# Patient Record
Sex: Male | Born: 1935 | Race: Black or African American | Hispanic: No | Smoking: Never smoker
Health system: Southern US, Community
[De-identification: ages and names within clinical notes are randomized; demographics above are authoritative.]

## PROBLEM LIST (undated history)

## (undated) DIAGNOSIS — K219 Gastro-esophageal reflux disease without esophagitis: Secondary | ICD-10-CM

## (undated) DIAGNOSIS — E119 Type 2 diabetes mellitus without complications: Secondary | ICD-10-CM

## (undated) DIAGNOSIS — N401 Enlarged prostate with lower urinary tract symptoms: Secondary | ICD-10-CM

## (undated) DIAGNOSIS — I10 Essential (primary) hypertension: Secondary | ICD-10-CM

## (undated) DIAGNOSIS — H547 Unspecified visual loss: Secondary | ICD-10-CM

## (undated) DIAGNOSIS — N138 Other obstructive and reflux uropathy: Secondary | ICD-10-CM

## (undated) DIAGNOSIS — H9190 Unspecified hearing loss, unspecified ear: Secondary | ICD-10-CM

## (undated) HISTORY — PX: SHOULDER SURGERY: SHX246

## (undated) HISTORY — PX: BACK SURGERY: SHX140

---

## 2015-05-13 ENCOUNTER — Emergency Department (HOSPITAL_COMMUNITY)
Admission: EM | Admit: 2015-05-13 | Discharge: 2015-05-13 | Disposition: A | Discharge status: Home / Self Care | Payer: Medicare Other | Attending: Emergency Medicine | Admitting: Emergency Medicine

## 2015-05-13 ENCOUNTER — Emergency Department (HOSPITAL_COMMUNITY): Payer: Medicare Other

## 2015-05-13 ENCOUNTER — Encounter (HOSPITAL_COMMUNITY): Payer: Self-pay

## 2015-05-13 DIAGNOSIS — Z9889 Other specified postprocedural states: Secondary | ICD-10-CM | POA: Diagnosis not present

## 2015-05-13 DIAGNOSIS — Z87448 Personal history of other diseases of urinary system: Secondary | ICD-10-CM | POA: Diagnosis not present

## 2015-05-13 DIAGNOSIS — M545 Low back pain, unspecified: Secondary | ICD-10-CM

## 2015-05-13 DIAGNOSIS — H540 Blindness, both eyes: Secondary | ICD-10-CM | POA: Insufficient documentation

## 2015-05-13 DIAGNOSIS — I10 Essential (primary) hypertension: Secondary | ICD-10-CM | POA: Diagnosis not present

## 2015-05-13 DIAGNOSIS — M549 Dorsalgia, unspecified: Secondary | ICD-10-CM

## 2015-05-13 DIAGNOSIS — E119 Type 2 diabetes mellitus without complications: Secondary | ICD-10-CM | POA: Diagnosis not present

## 2015-05-13 DIAGNOSIS — H9190 Unspecified hearing loss, unspecified ear: Secondary | ICD-10-CM | POA: Insufficient documentation

## 2015-05-13 DIAGNOSIS — R109 Unspecified abdominal pain: Secondary | ICD-10-CM | POA: Diagnosis not present

## 2015-05-13 DIAGNOSIS — Z8719 Personal history of other diseases of the digestive system: Secondary | ICD-10-CM | POA: Insufficient documentation

## 2015-05-13 HISTORY — DX: Unspecified hearing loss, unspecified ear: H91.90

## 2015-05-13 HISTORY — DX: Unspecified visual loss: H54.7

## 2015-05-13 HISTORY — DX: Other obstructive and reflux uropathy: N13.8

## 2015-05-13 HISTORY — DX: Essential (primary) hypertension: I10

## 2015-05-13 HISTORY — DX: Type 2 diabetes mellitus without complications: E11.9

## 2015-05-13 HISTORY — DX: Gastro-esophageal reflux disease without esophagitis: K21.9

## 2015-05-13 HISTORY — DX: Benign prostatic hyperplasia with lower urinary tract symptoms: N40.1

## 2015-05-13 LAB — URINALYSIS, ROUTINE W REFLEX MICROSCOPIC
Bilirubin Urine: NEGATIVE
Glucose, UA: NEGATIVE mg/dL
Nitrite: NEGATIVE
PH: 6.5 (ref 5.0–8.0)
SPECIFIC GRAVITY, URINE: 1.02 (ref 1.005–1.030)
Urobilinogen, UA: 0.2 mg/dL (ref 0.0–1.0)

## 2015-05-13 LAB — CBC WITH DIFFERENTIAL/PLATELET
Basophils Absolute: 0 10*3/uL (ref 0.0–0.1)
Basophils Relative: 0 % (ref 0–1)
EOS ABS: 0 10*3/uL (ref 0.0–0.7)
Eosinophils Relative: 0 % (ref 0–5)
HCT: 34.1 % — ABNORMAL LOW (ref 39.0–52.0)
HEMOGLOBIN: 11.1 g/dL — AB (ref 13.0–17.0)
LYMPHS ABS: 1.9 10*3/uL (ref 0.7–4.0)
Lymphocytes Relative: 24 % (ref 12–46)
MCH: 26.6 pg (ref 26.0–34.0)
MCHC: 32.6 g/dL (ref 30.0–36.0)
MCV: 81.6 fL (ref 78.0–100.0)
Monocytes Absolute: 0.8 10*3/uL (ref 0.1–1.0)
Monocytes Relative: 10 % (ref 3–12)
NEUTROS ABS: 5.1 10*3/uL (ref 1.7–7.7)
Neutrophils Relative %: 66 % (ref 43–77)
PLATELETS: 176 10*3/uL (ref 150–400)
RBC: 4.18 MIL/uL — ABNORMAL LOW (ref 4.22–5.81)
RDW: 13.3 % (ref 11.5–15.5)
WBC: 7.8 10*3/uL (ref 4.0–10.5)

## 2015-05-13 LAB — COMPREHENSIVE METABOLIC PANEL
ALT: 10 U/L — ABNORMAL LOW (ref 17–63)
ANION GAP: 10 (ref 5–15)
AST: 14 U/L — AB (ref 15–41)
Albumin: 3.5 g/dL (ref 3.5–5.0)
Alkaline Phosphatase: 53 U/L (ref 38–126)
BILIRUBIN TOTAL: 0.6 mg/dL (ref 0.3–1.2)
BUN: 11 mg/dL (ref 6–20)
CALCIUM: 9.2 mg/dL (ref 8.9–10.3)
CHLORIDE: 97 mmol/L — AB (ref 101–111)
CO2: 29 mmol/L (ref 22–32)
CREATININE: 0.85 mg/dL (ref 0.61–1.24)
GFR calc Af Amer: >60 mL/min (ref >60)
Glucose, Bld: 145 mg/dL — ABNORMAL HIGH (ref 65–99)
Potassium: 3.2 mmol/L — ABNORMAL LOW (ref 3.5–5.1)
Sodium: 136 mmol/L (ref 135–145)
TOTAL PROTEIN: 7 g/dL (ref 6.5–8.1)

## 2015-05-13 LAB — LIPASE, BLOOD: Lipase: 28 U/L (ref 22–51)

## 2015-05-13 LAB — URINE MICROSCOPIC-ADD ON

## 2015-05-13 MED ORDER — OXYCODONE-ACETAMINOPHEN 5-325 MG PO TABS
1.0000 | ORAL_TABLET | Freq: Once | ORAL | Status: AC
  Administered 2015-05-13: 1 via ORAL
  Filled 2015-05-13: qty 1

## 2015-05-13 MED ORDER — HYDROMORPHONE HCL 1 MG/ML IJ SOLN
0.5000 mg | Freq: Once | INTRAMUSCULAR | Status: AC
  Administered 2015-05-13: 0.5 mg via INTRAVENOUS
  Filled 2015-05-13: qty 1

## 2015-05-13 MED ORDER — HYDROCODONE-ACETAMINOPHEN 5-325 MG PO TABS: 1.0000 | ORAL_TABLET | Freq: Four times a day (QID) | ORAL | Status: AC | PRN

## 2015-05-13 NOTE — ED Notes (Signed)
Pt able to ambulate with 2 staff and minmum assistance to restroom.  Pt gait steady with uses of cane.

## 2015-05-13 NOTE — ED Notes (Signed)
Pt c/o pain in lower back x 3 weeks.  Reports history of intermittent lower back pain.   Reports this pain feels the same.   Denies injury.  ALso reports strong odor to urine for the past 24 hours.   Last voided several hours ago.   CBG 227 per EMS.

## 2015-05-13 NOTE — ED Notes (Signed)
Pt not able to ambulate.  C/o of catch in his back when standing.  Attempted with 2 staff members to assist with voiding, gait unsteady.

## 2015-05-13 NOTE — Discharge Instructions (Signed)
Take a stool softener, such as colace, available over the counter when taking your pain medication.  Your CT scan showed a spot on your right kidney that needs to be evaluated by your Urologist. Get rechecked immediately if you develop any new or concerning symptoms.    Back Pain, Adult Low back pain is very common. About 1 in 5 people have back pain.The cause of low back pain is rarely dangerous. The pain often gets better over time.About half of people with a sudden onset of back pain feel better in just 2 weeks. About 8 in 10 people feel better by 6 weeks.  CAUSES Some common causes of back pain include:  Strain of the muscles or ligaments supporting the spine.  Wear and tear (degeneration) of the spinal discs.  Arthritis.  Direct injury to the back. DIAGNOSIS Most of the time, the direct cause of low back pain is not known.However, back pain can be treated effectively even when the exact cause of the pain is unknown.Answering your caregiver's questions about your overall health and symptoms is one of the most accurate ways to make sure the cause of your pain is not dangerous. If your caregiver needs more information, he or she may order lab work or imaging tests (X-rays or MRIs).However, even if imaging tests show changes in your back, this usually does not require surgery. HOME CARE INSTRUCTIONS For many people, back pain returns.Since low back pain is rarely dangerous, it is often a condition that people can learn to [facility] their own.   Remain active. It is stressful on the back to sit or stand in one place. Do not sit, drive, or stand in one place for more than 30 minutes at a time. Take short walks on level surfaces as soon as pain allows.Try to increase the length of time you walk each day.  Do not stay in bed.Resting more than 1 or 2 days can delay your recovery.  Do not avoid exercise or work.Your body is made to move.It is not dangerous to be active, even though your  back may hurt.Your back will likely heal faster if you return to being active before your pain is gone.  Pay attention to your body when you bend and lift. Many people have less discomfortwhen lifting if they bend their knees, keep the load close to their bodies,and avoid twisting. Often, the most comfortable positions are those that put less stress on your recovering back.  Find a comfortable position to sleep. Use a firm mattress and lie on your side with your knees slightly bent. If you lie on your back, put a pillow under your knees.  Only take over-the-counter or prescription medicines as directed by your caregiver. Over-the-counter medicines to reduce pain and inflammation are often the most helpful.Your caregiver may prescribe muscle relaxant drugs.These medicines help dull your pain so you can more quickly return to your normal activities and healthy exercise.  Put ice on the injured area.  Put ice in a plastic bag.  Place a towel between your skin and the bag.  Leave the ice on for 15-20 minutes, 03-04 times a day for the first 2 to 3 days. After that, ice and heat may be alternated to reduce pain and spasms.  Ask your caregiver about trying back exercises and gentle massage. This may be of some benefit.  Avoid feeling anxious or stressed.Stress increases muscle tension and can worsen back pain.It is important to recognize when you are anxious or stressed and  learn ways to manage it.Exercise is a great option. SEEK MEDICAL CARE IF:  You have pain that is not relieved with rest or medicine.  You have pain that does not improve in 1 week.  You have new symptoms.  You are generally not feeling well. SEEK IMMEDIATE MEDICAL CARE IF:   You have pain that radiates from your back into your legs.  You develop new bowel or bladder control problems.  You have unusual weakness or numbness in your arms or legs.  You develop nausea or vomiting.  You develop abdominal  pain.  You feel faint. Document Released: 11/17/2005 Document Revised: 05/18/2012 Document Reviewed: 03/21/2014 [facility] Patient Information 2015 [place], [place]. This information is not intended to replace advice given to you by your health care provider. Make sure you discuss any questions you have with your health care provider.

## 2015-05-13 NOTE — ED Provider Notes (Signed)
CSN: [id number]     Arrival date & time 05/13/15  5537 History  This chart was scribed for Aaron Fossa, MD by [name], ED Scribe. This patient was seen in room APA03/APA03 and the patient's care was started at 10:15 AM.   Chief Complaint  Patient presents with  . Back Pain   The history is provided by the patient. No language interpreter was used.   HPI Comments: Aaron Hudson is a 79 y.o. male who presents to the Emergency Department complaining of constant, sharp and dull, periumbilical abdominal pain with radiation to the lower back pain onset 1 month ago.  He reports no particular change in his complaint prompted his visit today; the complaint just "bothered" him all night.  There are no aggravating or alleviating factors.  He reports his last normal bowel movement was 3 days ago.  He has also been having some difficulty urinating over the past three weeks and has been seen by a urologist but cannot recall his name.  He denies fever, n/v/d, dysuria, leg numbness, weakness, fall, injury.  NKA. Past Medical History  Diagnosis Date  . Hypertension   . Blind   . HOH (hard of hearing)   . GERD (gastroesophageal reflux disease)   . Diabetes mellitus without complication   . BPH (benign prostatic hypertrophy) with urinary obstruction    Past Surgical History  Procedure Laterality Date  . Back surgery    . Shoulder surgery     No family history on file. History  Substance Use Topics  . Smoking status: Never Smoker   . Smokeless tobacco: Not on file  . Alcohol Use: No     Comment: former    Review of Systems  Constitutional: Negative for fever.  Gastrointestinal: Positive for abdominal pain. Negative for nausea, vomiting and diarrhea.  Musculoskeletal: Positive for back pain.  Neurological: Negative for weakness and numbness.  All other systems reviewed and are negative.     Allergies  Review of patient's allergies indicates no known allergies.  Home Medications    Prior to Admission medications   Not on File   BP 166/75 mmHg  Pulse 63  Temp(Src) 98.6 F (37 C) (Oral)  Resp 18  Ht 5\' 5"  (1.651 m)  Wt 180 lb (81.647 kg)  BMI 29.95 kg/m2  SpO2 98% Physical Exam  Constitutional: He is oriented to person, place, and time. He appears well-developed and well-nourished. No distress.  HENT:  Head: Normocephalic and atraumatic.  Eyes:  Corneal clouding bilaterally.   Cardiovascular: Normal rate and regular rhythm.   No murmur heard. Pulmonary/Chest: Effort normal and breath sounds normal. No respiratory distress.  Abdominal: Soft. Bowel sounds are normal. There is no tenderness. There is no rebound and no guarding.  Musculoskeletal: He exhibits no edema or tenderness.  Reproducible pain to his back with raising either leg.  2+ DP pulses bilaterally.   Neurological: He is alert and oriented to person, place, and time.  5/5 strength in BLEs.   Skin: Skin is warm and dry.  Psychiatric: He has a normal mood and affect. His behavior is normal.  Nursing note and vitals reviewed.   ED Course  Procedures (including critical care time)  DIAGNOSTIC STUDIES: Oxygen Saturation is 98% on RA, normal by my interpretation.    COORDINATION OF CARE:  10:26 AM Discussed treatment plan with patient at bedside.  Patient acknowledges and agrees with plan.    Labs Review Labs Reviewed  URINALYSIS, ROUTINE W REFLEX MICROSCOPIC (NOT  AT [facility]) - Abnormal; Notable for the following:    Hgb urine dipstick TRACE (*)    Ketones, ur TRACE (*)    Protein, ur >300 (*)    Leukocytes, UA SMALL (*)    All other components within normal limits  URINE MICROSCOPIC-ADD ON - Abnormal; Notable for the following:    Squamous Epithelial / LPF FEW (*)    Bacteria, UA FEW (*)    All other components within normal limits  COMPREHENSIVE METABOLIC PANEL - Abnormal; Notable for the following:    Potassium 3.2 (*)    Chloride 97 (*)    Glucose, Bld 145 (*)    AST 14 (*)     ALT 10 (*)    All other components within normal limits  CBC WITH DIFFERENTIAL/PLATELET - Abnormal; Notable for the following:    RBC 4.18 (*)    Hemoglobin 11.1 (*)    HCT 34.1 (*)    All other components within normal limits  LIPASE, BLOOD    Imaging Review Ct Renal Stone Study  05/13/2015   CLINICAL DATA:  79 year old male with a 1 month history of constant periumbilical abdominal pain radiating into the lower back.  EXAM: CT ABDOMEN AND PELVIS WITHOUT CONTRAST  TECHNIQUE: Multidetector CT imaging of the abdomen and pelvis was performed following the standard protocol without IV contrast.  COMPARISON:  None.  FINDINGS: Lower Chest: Mild dependent atelectasis in the lower lobes. Mild cardiomegaly. Extensive calcifications along the course of the visualized coronary arteries. No pericardial effusion. Unremarkable visualized distal thoracic esophagus.  Abdomen: Unenhanced CT was performed per clinician order. Lack of IV contrast limits sensitivity and specificity, especially for evaluation of abdominal/pelvic solid viscera. Within these limitations, unremarkable CT appearance of the stomach, duodenum, spleen, adrenal glands and pancreas. Macro lobular contour of the liver is nonspecific. No discrete hepatic lesion. The gallbladder is surgically absent. No intra or extrahepatic biliary ductal dilatation.  Soft tissue density fullness noted within the right upper pole collecting system. This is incompletely evaluated in the absence of intravenous contrast but is concerning for possible urothelial malignancy versus centrally exophytic renal neoplasm. Otherwise, no focal contour abnormality involving either kidney. No hydronephrosis and no nephrolithiasis.  No evidence of obstruction or focal bowel wall thickening. Normal appendix in the right lower quadrant. The terminal ileum is unremarkable. Mild to moderate amount of colonic gas. No free fluid or suspicious adenopathy.  Pelvis: Unremarkable bladder,  prostate gland and seminal vesicles. No free fluid or suspicious adenopathy.  Bones/Soft Tissues: No acute fracture or aggressive appearing lytic or blastic osseous lesion. Prior surgical changes of L2- L5 posterior lumbar interbody fusion.  Vascular: Limited evaluation in the absence of intravenous contrast. Atherosclerotic vascular calcifications without evidence of aneurysm.  IMPRESSION: 1. No acute abnormality in the abdomen or pelvis to explain the patient's clinical symptoms. 2. Moderate volume of gas throughout the colon. This could represent a source for generalized abdominal discomfort. 3. Soft tissue density fullness in the upper pole collecting system of the right kidney is incompletely evaluated in the absence of intravenous contrast material. Differential considerations include a mildly complex and centrally exophytic renal cyst, a centrally exophytic renal neoplasm, and urothelial malignancy involving the upper pole collecting system. Recommend further evaluation with renal protocol CT scan if the patient's renal function permits. Alternately, renal MRI could be considered. 4. Atherosclerosis including multivessel coronary artery calcification. 5. Cardiomegaly. 6. Prior lower lumbar posterior fusion. Degenerative disc disease present at L1-L2.   Electronically Signed  By: Malachy Moan M.D.   On: 05/13/2015 11:43     EKG Interpretation None      MDM   Final diagnoses:  Bilateral low back pain without sciatica    Patient here for evaluation of back pain. Patient is blind. History of presentation is not consistent with cauda equina or epidural abscess. There is no evidence of acute infectious process. CT scan does show a renal cyst versus tumor. Patient is unaware if this is new or old, discussed the findings with patient and his son Ortencia Kick about importance of urology follow-up for further evaluation. Patient feels improved after pain medicine. Discussed outpatient follow-up and return  precautions.  I personally performed the services described in this documentation, which was scribed in my presence. The recorded information has been reviewed and is accurate.     Aaron Fossa, MD 05/14/15 [phone]

## 2015-11-13 ENCOUNTER — Encounter: Payer: Medicare Other | Admitting: Orthopedic Surgery

## 2015-12-11 ENCOUNTER — Ambulatory Visit (INDEPENDENT_AMBULATORY_CARE_PROVIDER_SITE_OTHER): Payer: Medicare Other | Admitting: Orthopedic Surgery

## 2015-12-11 ENCOUNTER — Ambulatory Visit (INDEPENDENT_AMBULATORY_CARE_PROVIDER_SITE_OTHER): Payer: Medicare Other

## 2015-12-11 ENCOUNTER — Encounter: Payer: Self-pay | Admitting: Orthopedic Surgery

## 2015-12-11 DIAGNOSIS — S42202A Unspecified fracture of upper end of left humerus, initial encounter for closed fracture: Secondary | ICD-10-CM

## 2015-12-11 NOTE — Progress Notes (Signed)
NEW FRACTURE  Chief Complaint  Patient presents with  . Arm Injury    Lt proximal humerus fracture, DOI 09/11/15    HPI this patient presents to us for the second time again without any paperwork filled out complaining of a left proximal humerus fracture [place] in [place] on or about October 2016.  He now does not complain of any pain in the left shoulder. He says he's regained most of the use back in his left arm. There are no associated signs or symptoms of swelling, pain. He does have some stiffness in the left shoulder  Review of systems again he says that there is nothing bothering him making his total review of systems negative but I don't believe that he has really filled out all of this paperwork. He listed his medical history is negative his surgical history is negative his allergy history is negative social history is negative. Lives in a group home he did bring a medication list.   BP 158/71 mmHg  Ht 5\' 5"  (1.651 m)  Wt 181 lb (82.101 kg)  BMI 30.12 kg/m2 Gen. appearance grooming and hygiene are normal he has a cane in his hand He is oriented to person place and time Mood is pleasant. Ambulatory status independent with a cane  Left shoulder no tenderness swelling or deformity Passive range of motion 45 external rotation 90 abduction 130 flexion Stability tests were normal Strength test normal internal/external rotation  Skin normal Sensation normal Pulse is normal Lymph nodes axilla negative   Data review First x-ray on the disc proximal humerus fracture is my diagnosis nondisplaced  Order and review x-ray in the office I interpret again healed proximal humerus fracture nondisplaced   Plan  Patient is discharged to normal activity

## 2016-12-09 IMAGING — CT CT RENAL STONE PROTOCOL
2 of 4 series · 15 of 46 positions shown, 17 images · non-contrast
Comparison: None.

CLINICAL DATA: 78-year-old male with a 1 month history of constant
periumbilical abdominal pain radiating into the lower back.

EXAM:
CT ABDOMEN AND PELVIS WITHOUT CONTRAST
TECHNIQUE: Multidetector CT imaging of the abdomen and pelvis was performed
following the standard protocol without IV contrast.

[Series 2: standard/full over (age)lbs 5.0 · axial · 0.72mm/px · z∈[-414,-18]mm · 12 of 95 slices shown, 14 images]
[im 8/95  soft-tissue]
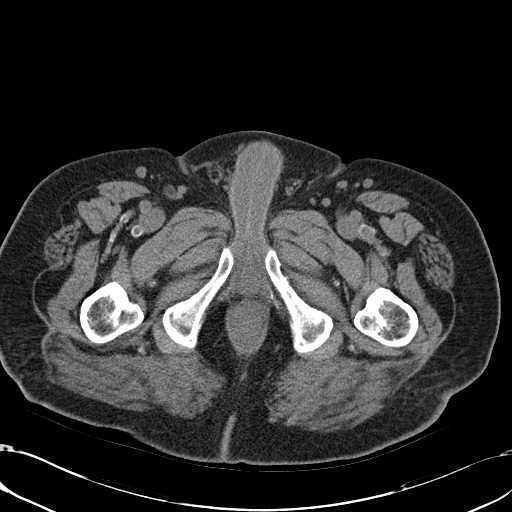
[im 8/95  bone]
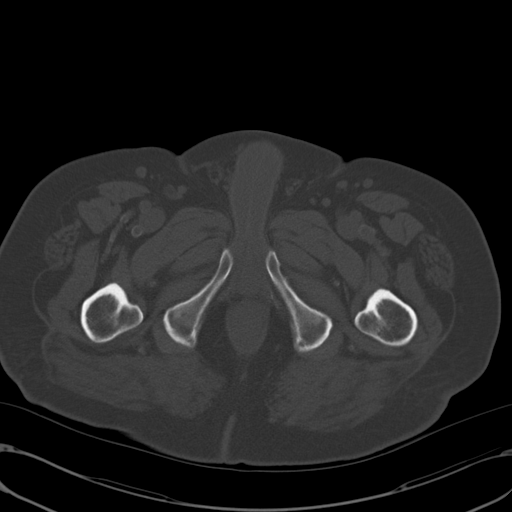
[im 15/95  soft-tissue]
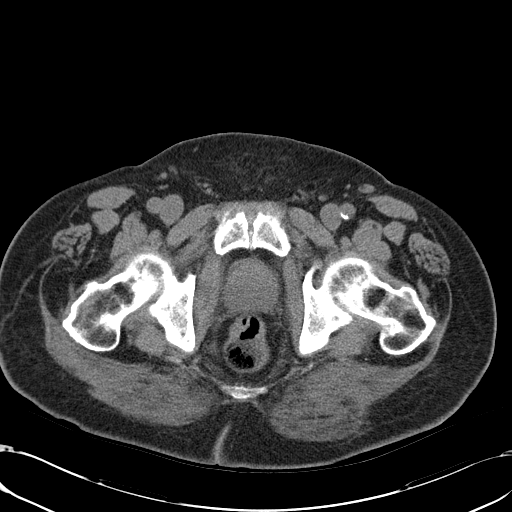
[im 22/95  soft-tissue]
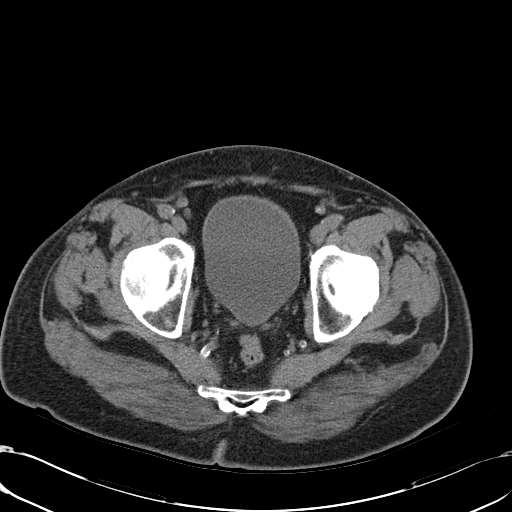
[im 29/95  soft-tissue]
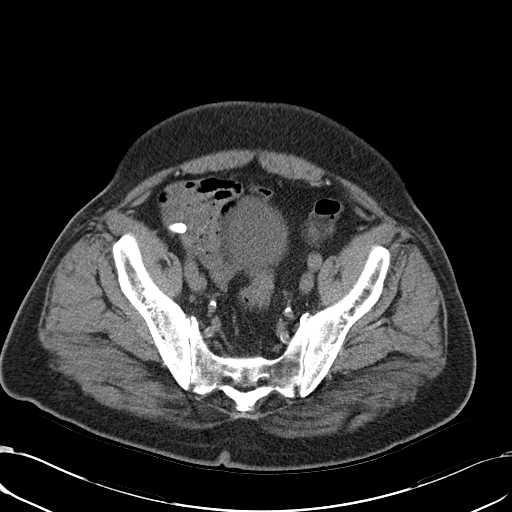
[im 37/95  soft-tissue]
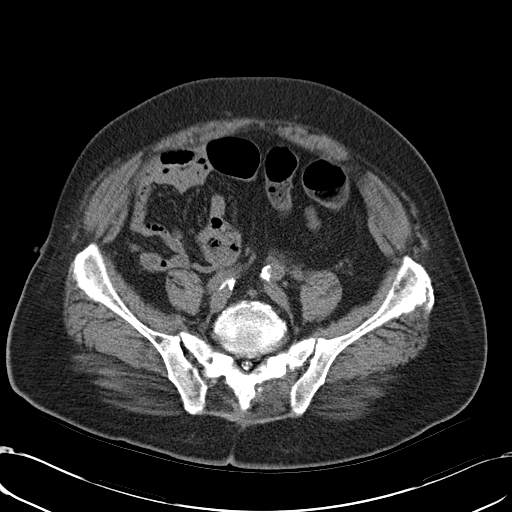
[im 44/95  soft-tissue]
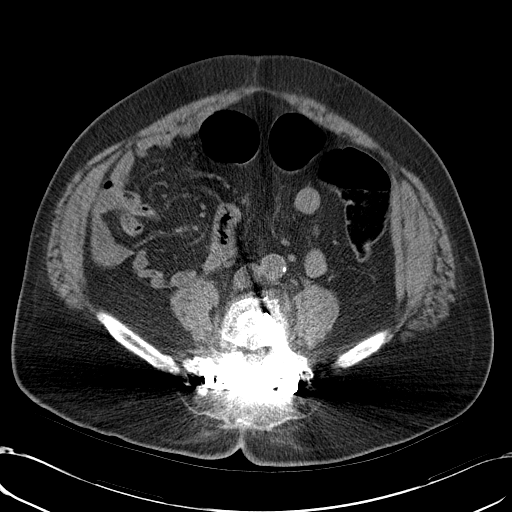
[im 51/95  soft-tissue]
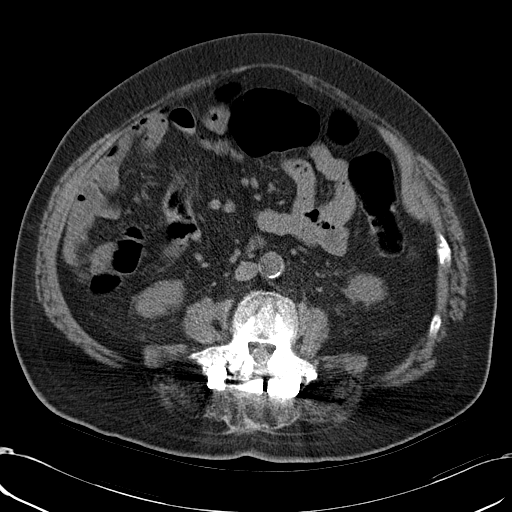
[im 58/95  soft-tissue]
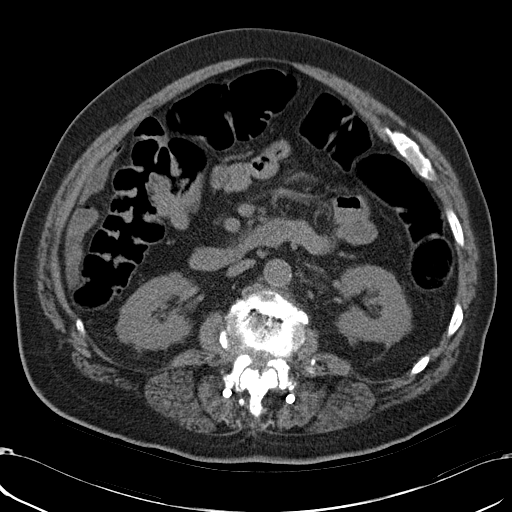
[im 66/95  soft-tissue]
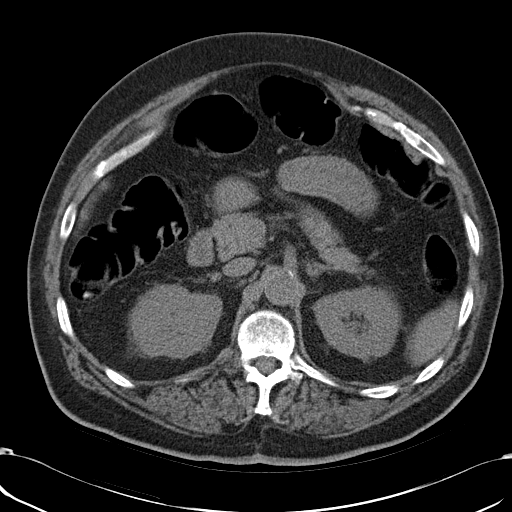
[im 66/95  bone]
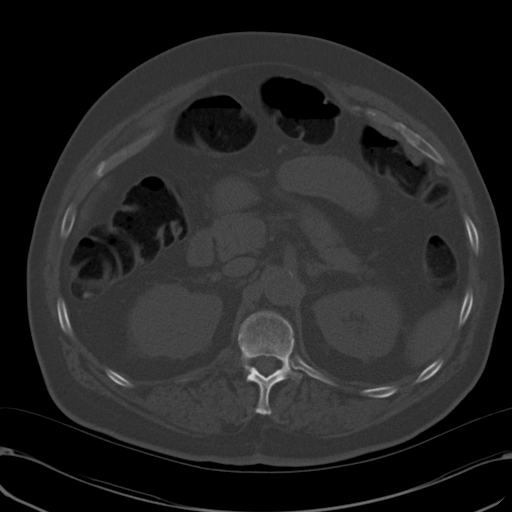
[im 73/95  soft-tissue]
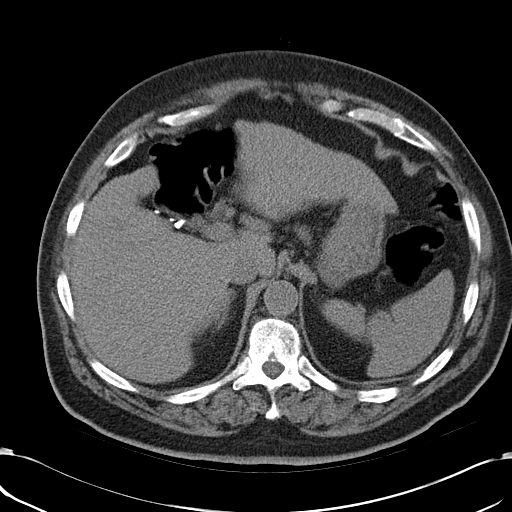
[im 80/95  soft-tissue]
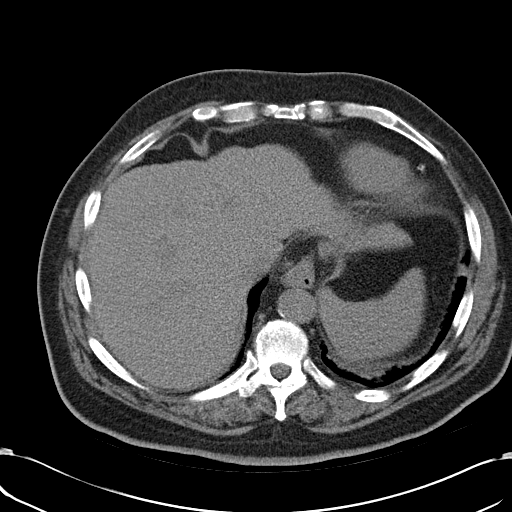
[im 87/95  soft-tissue]
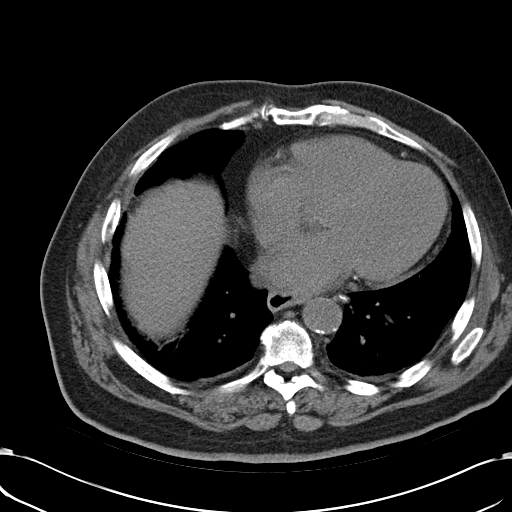

[Series 3: mpr coronal · coronal · 0.75mm/px · 3 of 107 slices shown]
[im 36/107  soft-tissue]
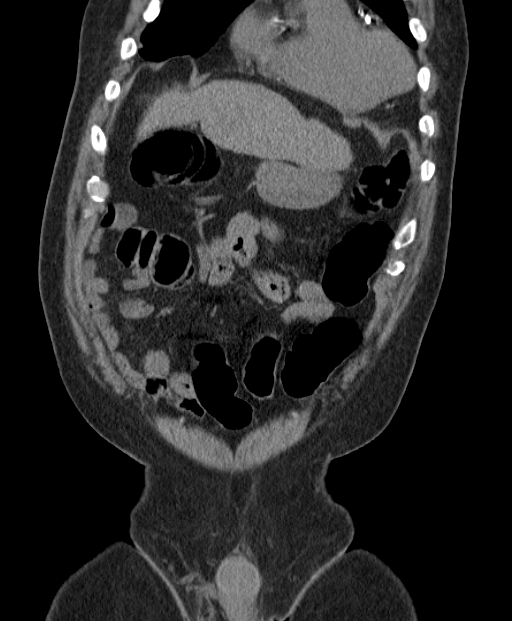
[im 48/107  soft-tissue]
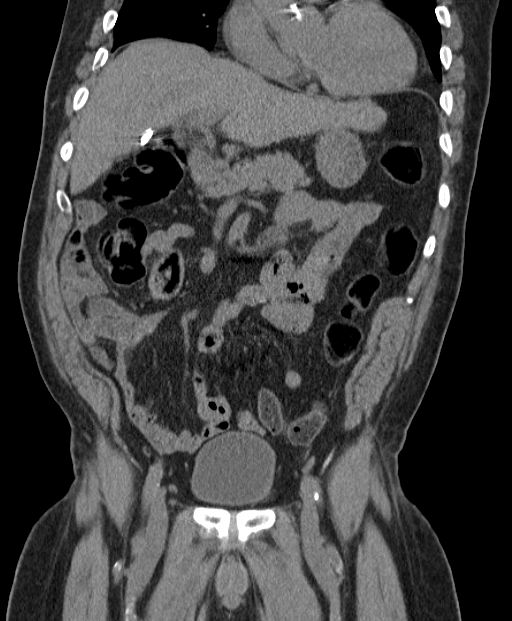
[im 59/107  soft-tissue]
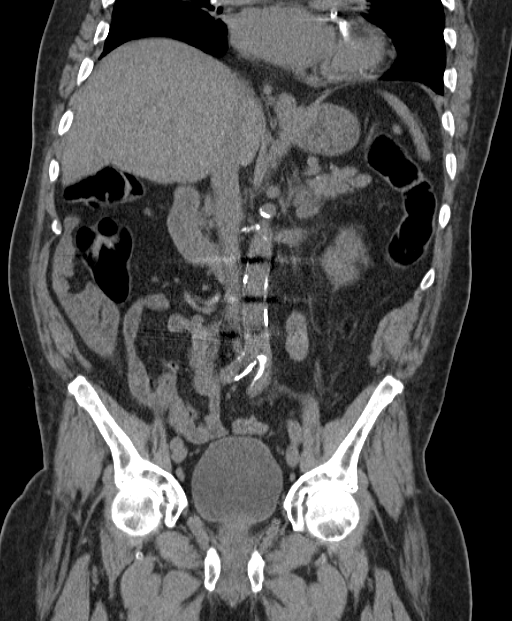

[15 of 46 positions shown; findings below may reference images not displayed]

FINDINGS: Lower Chest: Mild dependent atelectasis in the lower lobes. Mild
cardiomegaly. Extensive calcifications along the course of the
visualized coronary arteries. No pericardial effusion. Unremarkable
visualized distal thoracic esophagus.

Abdomen: Unenhanced CT was performed per clinician order. Lack of IV
contrast limits sensitivity and specificity, especially for
evaluation of abdominal/pelvic solid viscera. Within these
limitations, unremarkable CT appearance of the stomach, duodenum,
spleen, adrenal glands and pancreas. Macro lobular contour of the
liver is nonspecific. No discrete hepatic lesion. The gallbladder is
surgically absent. No intra or extrahepatic biliary ductal
dilatation.

Soft tissue density fullness noted within the right upper pole
collecting system. This is incompletely evaluated in the absence of
intravenous contrast but is concerning for possible urothelial
malignancy versus centrally exophytic renal neoplasm. Otherwise, no
focal contour abnormality involving either kidney. No hydronephrosis
and no nephrolithiasis.

No evidence of obstruction or focal bowel wall thickening. Normal
appendix in the right lower quadrant. The terminal ileum is
unremarkable. Mild to moderate amount of colonic gas. No free fluid
or suspicious adenopathy.

Pelvis: Unremarkable bladder, prostate gland and seminal vesicles.
No free fluid or suspicious adenopathy.

Bones/Soft Tissues: No acute fracture or aggressive appearing lytic
or blastic osseous lesion. Prior surgical changes of L2- L5
posterior lumbar interbody fusion.

Vascular: Limited evaluation in the absence of intravenous contrast.
Atherosclerotic vascular calcifications without evidence of
aneurysm.
IMPRESSION: 1. No acute abnormality in the abdomen or pelvis to explain the
patient's clinical symptoms.
2. Moderate volume of gas throughout the colon. This could represent
a source for generalized abdominal discomfort.
3. Soft tissue density fullness in the upper pole collecting system
of the right kidney is incompletely evaluated in the absence of
intravenous contrast material. Differential considerations include a
mildly complex and centrally exophytic renal cyst, a centrally
exophytic renal neoplasm, and urothelial malignancy involving the
upper pole collecting system. Recommend further evaluation with
renal protocol CT scan if the patient's renal function permits.
Alternately, renal MRI could be considered.
4. Atherosclerosis including multivessel coronary artery
calcification.
5. Cardiomegaly.
6. Prior lower lumbar posterior fusion. Degenerative disc disease
present at L1-L2.

## 2018-02-09 ENCOUNTER — Emergency Department (HOSPITAL_COMMUNITY): Payer: Medicare Other

## 2018-02-09 ENCOUNTER — Emergency Department (HOSPITAL_COMMUNITY)
Admission: EM | Admit: 2018-02-09 | Discharge: 2018-02-09 | Disposition: A | Discharge status: Home / Self Care | Payer: Medicare Other | Attending: Emergency Medicine | Admitting: Emergency Medicine

## 2018-02-09 ENCOUNTER — Encounter (HOSPITAL_COMMUNITY): Payer: Self-pay

## 2018-02-09 DIAGNOSIS — M79661 Pain in right lower leg: Secondary | ICD-10-CM | POA: Diagnosis present

## 2018-02-09 DIAGNOSIS — I10 Essential (primary) hypertension: Secondary | ICD-10-CM | POA: Insufficient documentation

## 2018-02-09 DIAGNOSIS — I82401 Acute embolism and thrombosis of unspecified deep veins of right lower extremity: Secondary | ICD-10-CM | POA: Insufficient documentation

## 2018-02-09 DIAGNOSIS — Z7984 Long term (current) use of oral hypoglycemic drugs: Secondary | ICD-10-CM | POA: Insufficient documentation

## 2018-02-09 DIAGNOSIS — Z79899 Other long term (current) drug therapy: Secondary | ICD-10-CM | POA: Diagnosis not present

## 2018-02-09 DIAGNOSIS — E119 Type 2 diabetes mellitus without complications: Secondary | ICD-10-CM | POA: Insufficient documentation

## 2018-02-09 LAB — COMPREHENSIVE METABOLIC PANEL
ALBUMIN: 1.9 g/dL — AB (ref 3.5–5.0)
ALT: 8 U/L — ABNORMAL LOW (ref 17–63)
ANION GAP: 11 (ref 5–15)
AST: 14 U/L — ABNORMAL LOW (ref 15–41)
Alkaline Phosphatase: 95 U/L (ref 38–126)
BILIRUBIN TOTAL: 0.6 mg/dL (ref 0.3–1.2)
BUN: 37 mg/dL — ABNORMAL HIGH (ref 6–20)
CO2: 21 mmol/L — ABNORMAL LOW (ref 22–32)
Calcium: 9.3 mg/dL (ref 8.9–10.3)
Chloride: 100 mmol/L — ABNORMAL LOW (ref 101–111)
Creatinine, Ser: 2.2 mg/dL — ABNORMAL HIGH (ref 0.61–1.24)
GFR calc Af Amer: 31 mL/min — ABNORMAL LOW (ref >60)
GFR, EST NON AFRICAN AMERICAN: 27 mL/min — AB (ref >60)
GLUCOSE: 207 mg/dL — AB (ref 65–99)
POTASSIUM: 4.9 mmol/L (ref 3.5–5.1)
Sodium: 132 mmol/L — ABNORMAL LOW (ref 135–145)
TOTAL PROTEIN: 7.1 g/dL (ref 6.5–8.1)

## 2018-02-09 LAB — CBC WITH DIFFERENTIAL/PLATELET
BASOS ABS: 0 10*3/uL (ref 0.0–0.1)
Basophils Relative: 0 %
Eosinophils Absolute: 0 10*3/uL (ref 0.0–0.7)
Eosinophils Relative: 0 %
HEMATOCRIT: 26.1 % — AB (ref 39.0–52.0)
HEMOGLOBIN: 8 g/dL — AB (ref 13.0–17.0)
LYMPHS PCT: 12 %
Lymphs Abs: 1.5 10*3/uL (ref 0.7–4.0)
MCH: 23.4 pg — ABNORMAL LOW (ref 26.0–34.0)
MCHC: 30.7 g/dL (ref 30.0–36.0)
MCV: 76.3 fL — AB (ref 78.0–100.0)
Monocytes Absolute: 1 10*3/uL (ref 0.1–1.0)
Monocytes Relative: 8 %
NEUTROS ABS: 10.2 10*3/uL — AB (ref 1.7–7.7)
Neutrophils Relative %: 80 %
Platelets: 230 10*3/uL (ref 150–400)
RBC: 3.42 MIL/uL — AB (ref 4.22–5.81)
RDW: 18.2 % — ABNORMAL HIGH (ref 11.5–15.5)
WBC: 12.7 10*3/uL — AB (ref 4.0–10.5)

## 2018-02-09 LAB — D-DIMER, QUANTITATIVE (NOT AT ARMC): D DIMER QUANT: 3.59 ug{FEU}/mL — AB (ref 0.00–0.50)

## 2018-02-09 LAB — POC OCCULT BLOOD, ED: Fecal Occult Bld: NEGATIVE

## 2018-02-09 MED ORDER — APIXABAN 5 MG PO TABS: ORAL_TABLET | ORAL | 0 refills | Status: AC

## 2018-02-09 MED ORDER — APIXABAN 5 MG PO TABS
10.0000 mg | ORAL_TABLET | Freq: Once | ORAL | Status: AC
  Administered 2018-02-09: 10 mg via ORAL
  Filled 2018-02-09: qty 2

## 2018-02-09 NOTE — ED Provider Notes (Signed)
[facility] EMERGENCY DEPARTMENT Provider Note   CSN: [id number] Arrival date & time: 02/09/18  4098     History   Chief Complaint Chief Complaint  Patient presents with  . Leg Pain    HPI Aaron Hudson is a 82 y.o. male.  Patient is a 82 year old male resident of [facility].  Patient presents to the emergency department because of swelling of the right lower extremity.  The staff at the [facility] told the EMS personnel that the patient had sustained a fall.  The patient states that he stumbled and fell.  He is legally blind.  He states he is not sure if he hurt his leg or not but he is having some discomfort in his pelvis and hip area.  He states he has pain of the entire right leg.  And he feels like the area is swollen.  The patient is currently not on any anticoagulation medications.  It is of note however that he has adrenal cancer and right renal cancer.  He presents now for assistance with this issue.  He denies hitting his head.  He denies difficulty with breathing or problems with his chest.      Past Medical History:  Diagnosis Date  . Blind   . BPH (benign prostatic hypertrophy) with urinary obstruction   . Diabetes mellitus without complication (HCC)   . GERD (gastroesophageal reflux disease)   . HOH (hard of hearing)   . Hypertension     There are no active problems to display for this patient.   Past Surgical History:  Procedure Laterality Date  . BACK SURGERY    . SHOULDER SURGERY         Home Medications    Prior to Admission medications   Medication Sig Start Date End Date Taking? Authorizing Provider  acetaminophen (MAPAP ARTHRITIS PAIN) 650 MG CR tablet Take 650 mg by mouth 3 (three) times daily.   Yes [provider]  amLODipine (NORVASC) 5 MG tablet Take 5 mg by mouth daily.   Yes [provider]  ferrous sulfate 325 (65 FE) MG tablet Take 325 mg by mouth 2 (two) times daily.   Yes [provider]  magnesium oxide (MAG-OX) 400 MG tablet Take 400 mg by mouth daily.   Yes [provider]  ondansetron (ZOFRAN) 4 MG tablet Take 4 mg by mouth 3 (three) times daily.   Yes [provider]  alum & mag hydroxide-simeth (ANTACID) 400-400-40 MG/5ML suspension Take by mouth every 6 (six) hours as needed for indigestion.    [provider]  apixaban (ELIQUIS) 5 MG TABS tablet 2 tabs bid for 7 days, then 1 tab bid 02/09/18   Ivery Quale, PA-C  brimonidine-timolol (COMBIGAN) 0.2-0.5 % ophthalmic solution Place 1 drop into both eyes daily.    [provider]  Cholecalciferol (VITAMIN D-3) 1000 UNITS CAPS Take 2,000 Units by mouth daily.    [provider]  finasteride (PROSCAR) 5 MG tablet Take 5 mg by mouth daily.    [provider]  HYDROcodone-acetaminophen (NORCO/VICODIN) 5-325 MG per tablet Take 1 tablet by mouth every 6 (six) hours as needed. Patient not taking: Reported on 02/09/2018 05/13/15   [name], MD  metFORMIN (GLUCOPHAGE) 500 MG tablet Take 500 mg by mouth daily at 12 noon. Daily at noon    [provider]  pantoprazole (PROTONIX) 40 MG tablet Take 40 mg by mouth daily.    [provider]  polyethylene  glycol (MIRALAX / GLYCOLAX) packet Take 17 g by mouth daily as needed for mild constipation.     [provider]  Propylene Glycol (SYSTANE BALANCE OP) Place 2 drops into both eyes 2 (two) times daily.    [provider]  tamsulosin (FLOMAX) 0.4 MG CAPS capsule Take 0.8 mg by mouth daily after supper.    [provider]  vitamin B-12 (CYANOCOBALAMIN) 1000 MCG tablet Take 1,000 mcg by mouth daily.    [provider]    Family History No family history on file.  Social History Social History   Tobacco Use  . Smoking status: Never Smoker  . Smokeless tobacco: Never Used  Substance Use Topics  . Alcohol use: No    Comment: former  . Drug use: No      Allergies   Hydrocodone and Sulfa antibiotics   Review of Systems Review of Systems  Constitutional: Negative for activity change.       All ROS Neg except as noted in HPI  HENT: Negative for nosebleeds.   Eyes: Negative for photophobia and discharge.       Patient is blind  Respiratory: Negative for cough, shortness of breath and wheezing.   Cardiovascular: Negative for chest pain and palpitations.  Gastrointestinal: Negative for abdominal pain and blood in stool.  Genitourinary: Negative for dysuria, frequency and hematuria.  Musculoskeletal: Positive for arthralgias. Negative for back pain and neck pain.  Skin: Negative.   Neurological: Negative for dizziness, seizures and speech difficulty.  Psychiatric/Behavioral: Negative for confusion and hallucinations.     Physical Exam Updated Vital Signs BP 114/68   Pulse 75   Temp (!) 97.5 F (36.4 C) (Oral)   Resp (!) 22   Wt 82.1 kg (181 lb) Comment: pulled from last visit  SpO2 100%   BMI 30.12 kg/m   Physical Exam  Constitutional: He is oriented to person, place, and time. He appears well-developed and well-nourished.  Non-toxic appearance.  HENT:  Head: Normocephalic.  Right Ear: Tympanic membrane and external ear normal.  Left Ear: Tympanic membrane and external ear normal.  Eyes: Lids are normal.  Patient is blind.  Neck: Normal range of motion. Neck supple. Carotid bruit is not present.  Cardiovascular: Normal rate, regular rhythm, normal heart sounds, intact distal pulses and normal pulses.  Pulmonary/Chest: Breath sounds normal. No stridor. No respiratory distress. He has no wheezes.  Abdominal: Soft. Bowel sounds are normal. There is no tenderness. There is no guarding.  Genitourinary: Penis normal. Rectal exam shows guaiac negative stool.  Genitourinary Comments: No rectal mass appreciated.  No blood on the gloved finger.  Musculoskeletal:       Right shoulder: He exhibits decreased range of motion,  swelling and pain.  There is swelling from the thigh to the foot of the right lower extremity.  There is no palpable deformity of the right hip, right knee, right ankle, or right foot.  The dorsalis pedis pulses 2+.  There is 2+ pitting edema from the anterior tibial tuberosity to the foot.  Lymphadenopathy:       Head (right side): No submandibular adenopathy present.       Head (left side): No submandibular adenopathy present.    He has no cervical adenopathy.  Neurological: He is alert and oriented to person, place, and time. He has normal strength. No cranial nerve deficit or sensory deficit.  Skin: Skin is warm and dry.  Psychiatric: He has a normal mood and affect. His speech is  normal.  Nursing note and vitals reviewed.    ED Treatments / Results  Labs (all labs ordered are listed, but only abnormal results are displayed) Labs Reviewed  D-DIMER, QUANTITATIVE (NOT AT [facility]) - Abnormal; Notable for the following components:      Result Value   D-Dimer, Quant 3.59 (*)    All other components within normal limits  COMPREHENSIVE METABOLIC PANEL - Abnormal; Notable for the following components:   Sodium 132 (*)    Chloride 100 (*)    CO2 21 (*)    Glucose, Bld 207 (*)    BUN 37 (*)    Creatinine, Ser 2.20 (*)    Albumin 1.9 (*)    AST 14 (*)    ALT 8 (*)    GFR calc non Af Amer 27 (*)    GFR calc Af Amer 31 (*)    All other components within normal limits  CBC WITH DIFFERENTIAL/PLATELET - Abnormal; Notable for the following components:   WBC 12.7 (*)    RBC 3.42 (*)    Hemoglobin 8.0 (*)    HCT 26.1 (*)    MCV 76.3 (*)    MCH 23.4 (*)    RDW 18.2 (*)    Neutro Abs 10.2 (*)    All other components within normal limits    EKG  EKG Interpretation  Date/Time:  Tuesday February 09 2018 12:31:42 EDT Ventricular Rate:  72 PR Interval:    QRS Duration: 93 QT Interval:  384 QTC Calculation: 421 R Axis:   57 Text Interpretation:  Sinus rhythm Low voltage, extremity leads  Minimal ST elevation, anterior leads Confirmed by Bethann Berkshire [phone]) on 02/09/2018 12:55:46 PM       Radiology US Venous Img Lower Unilateral Right  Result Date: 02/09/2018 CLINICAL DATA:  Right lower extremity pain and swelling after fall, elevated D-dimer EXAM: RIGHT LOWER EXTREMITY VENOUS DOPPLER ULTRASOUND TECHNIQUE: Gray-scale sonography with graded compression, as well as color Doppler and duplex ultrasound were performed to evaluate the lower extremity deep venous systems from the level of the common femoral vein and including the common femoral, femoral, profunda femoral, popliteal and calf veins including the posterior tibial, peroneal and gastrocnemius veins when visible. The superficial great saphenous vein was also interrogated. Spectral Doppler was utilized to evaluate flow at rest and with distal augmentation maneuvers in the common femoral, femoral and popliteal veins. COMPARISON:  None. FINDINGS: Contralateral Common Femoral Vein: Respiratory phasicity is normal and symmetric with the symptomatic side. No evidence of thrombus. Normal compressibility. Common Femoral Vein: Acute occlusive hypoechoic thrombus. Vessel is noncompressible. No phasic flow. Saphenofemoral Junction: Occlusive thrombus extends into the saphenofemoral junction and the saphenous vein. Profunda Femoral Vein: Acute occlusive hypoechoic thrombus throughout. Vessel is noncompressible. No phasic flow. Femoral Vein: Acute occlusive thrombus throughout. Vessel is noncompressible. No phasic flow. Popliteal Vein: Acute occlusive thrombus. Vessel is noncompressible. No phasic flow. Calf Veins: DVT appears to extend into the calf tibial and peroneal veins. Superficial Great Saphenous Vein: Diffusely thrombosed as well. Venous Reflux:  Not assessed Other Findings:  Peripheral edema noted. IMPRESSION: Extensive acute occlusive right lower extremity DVT from the common femoral vein into the calf veins. Electronically Signed   By:  Judie Petit.  Shick M.D.   On: 02/09/2018 13:55   Dg Hip Unilat W Or Wo Pelvis 2-3 Views Right  Result Date: 02/09/2018 CLINICAL DATA:  Pain.  Questionable trauma history EXAM: DG HIP (WITH OR WITHOUT PELVIS) 2-3V RIGHT COMPARISON:  None. FINDINGS: Frontal  pelvis as well as frontal and lateral right hip images obtained. There is no appreciable fracture or dislocation. Hip joints appear symmetric bilaterally. Sacroiliac joints appear normal. There is postoperative change in the lower lumbar spine. A double-J stent is incompletely visualized on the right. Multiple foci of calcification in the superficial femoral and profunda femoral arteries bilaterally noted. IMPRESSION: No fracture or dislocation. Hip and sacroiliac joints appear symmetric and unremarkable bilaterally. Incomplete visualization of double-J stent on the right. Extensive atherosclerotic vascular calcification noted. Electronically Signed   By: Bretta BangWilliam  Woodruff III M.D.   On: 02/09/2018 12:05    Procedures Procedures (including critical care time)  Medications Ordered in ED Medications  apixaban (ELIQUIS) tablet 10 mg (10 mg Oral Given 02/09/18 1615)     Initial Impression / Assessment and Plan / ED Course  I have reviewed the triage vital signs and the nursing notes.  Pertinent labs & imaging results that were available during my care of the patient were reviewed by me and considered in my medical decision making (see chart for details).      Patient discussed with Dr. Estell HarpinZammit. Final Clinical Impressions(s) / ED Diagnoses MDM  Vital signs within normal limits.  Patient noted to have pain and swelling from the mid thigh to the foot.  There is pain with manipulation of the pelvis.  There is no noted shortening of the right lower extremity. X-ray of the right hip and pelvis show no fracture or dislocation.  D-dimer is elevated at 3.59.  Results discussed with Dr. Estell HarpinZammit.  Comprehensive metabolic panel shows the sodium to be low at  132, CO2 slightly low at 21, glucose is elevated at 207.  The BUN is elevated at 37 and these creatinine is elevated at 2.20.  I suspect the patient may be slightly dry, and also some of this may be related to the patient's cancer medications.  The AST is low at 14, and ALT is low at 8.  Review of records from the [facility] showed that the patient's most recent hemoglobin was 9.  Stool guaiac is negative for occult blood.  The complete blood count shows white blood cells to be elevated at 12,700, the hemoglobin is 8, the hematocrit is 26.1. An ultrasound of the right lower extremity was obtained, and found to show DVT from the common femoral veins to the calf veins.  Case discussed with pharmacy for anticoagulation given the patient's renal status.  It was the opinion that Eliquis would be the best medication to use.  Case also discussed with the cancer care center here at the hospital.  A call was also placed to the [facility].  Patient placed on Eliquis 10 mg twice daily for the first week, and then 5 mg after that.  I have asked the nursing facility personnel to have the primary physician to evaluate the hemoglobin as well as the rise in renal function.  Patient is invited to return to the emergency department if any changes in his condition, changes, or concerns.  Patient is in agreement with this plan.   Final diagnoses:  Acute deep vein thrombosis (DVT) of right lower extremity, unspecified vein [facility](HCC)    ED Discharge Orders        Ordered    apixaban (ELIQUIS) 5 MG TABS tablet     02/09/18 1505       Ivery QualeBryant, Gaspare Netzel, PA-C 02/09/18 1700    Bethann BerkshireZammit, Joseph, MD 02/10/18 [phone]

## 2018-02-09 NOTE — ED Notes (Signed)
EKG results given to Dr. Estell HarpinZammit.

## 2018-02-09 NOTE — ED Notes (Signed)
Pt given peanut butter crackers, 2 cups of water, assisted to turn on side for comfort

## 2018-02-09 NOTE — ED Triage Notes (Signed)
Pt resident of [facility].  Reports called ems because pt has swelling to r leg.  Pt told staff he fell.

## 2018-02-09 NOTE — Discharge Instructions (Addendum)
You have a blood clot (deep vein thrombus) involving your right lower extremity.  Please use Eliquis 10 mg 2 times daily from March 12 - March 19, on March 20 please start 5 mg twice daily.  Please have your doctor recheck your leg soon as possible.  Your hemoglobin is low and your kidney functions are elevated.  Please have your doctor recheck this as well.

## 2018-02-09 NOTE — ED Notes (Signed)
[facility] called to arrange pickup for pt to [facility] per RN Mechele ClaudeBrande Oakley

## 2018-02-09 NOTE — ED Notes (Signed)
Waiting for EMS to pick up pt

## 2018-02-09 NOTE — ED Notes (Signed)
Have attempted to contact Dr. Melton AlarHiggs, oncologist, x3 for Aaron QualeHobson Bryant, PA.

## 2018-02-09 NOTE — ED Notes (Signed)
Hobson PA notified d-dimer 3.59 and eck completed.

## 2018-02-09 NOTE — ED Notes (Signed)
Have paged lab to come draw blood work  

## 2018-02-09 NOTE — ED Notes (Signed)
[facility] and notified her of pt's discharge status. Stated they would arrange pickup

## 2018-05-01 DEATH — deceased

## 2022-08-30 NOTE — Progress Notes (Signed)
No chief complaint on file.
